# Patient Record
Sex: Female | Born: 1995 | Race: White | Hispanic: No | Marital: Single | Smoking: Current every day smoker
Health system: Southern US, Community
[De-identification: ages and names within clinical notes are randomized; demographics above are authoritative.]

## PROBLEM LIST (undated history)

## (undated) DIAGNOSIS — F419 Anxiety disorder, unspecified: Secondary | ICD-10-CM

## (undated) DIAGNOSIS — G43909 Migraine, unspecified, not intractable, without status migrainosus: Secondary | ICD-10-CM

## (undated) DIAGNOSIS — F329 Major depressive disorder, single episode, unspecified: Secondary | ICD-10-CM

## (undated) DIAGNOSIS — F32A Depression, unspecified: Secondary | ICD-10-CM

---
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
## 2017-07-26 ENCOUNTER — Encounter (HOSPITAL_COMMUNITY): Payer: Self-pay

## 2017-07-26 ENCOUNTER — Other Ambulatory Visit: Payer: Self-pay

## 2017-07-26 ENCOUNTER — Emergency Department (HOSPITAL_COMMUNITY)
Admission: EM | Admit: 2017-07-26 | Discharge: 2017-07-26 | Disposition: A | Discharge status: Home / Self Care | Payer: Medicaid Other | Attending: Emergency Medicine | Admitting: Emergency Medicine

## 2017-07-26 DIAGNOSIS — Z79899 Other long term (current) drug therapy: Secondary | ICD-10-CM | POA: Insufficient documentation

## 2017-07-26 DIAGNOSIS — N63 Unspecified lump in unspecified breast: Secondary | ICD-10-CM

## 2017-07-26 DIAGNOSIS — Z87891 Personal history of nicotine dependence: Secondary | ICD-10-CM | POA: Diagnosis not present

## 2017-07-26 DIAGNOSIS — N6310 Unspecified lump in the right breast, unspecified quadrant: Secondary | ICD-10-CM | POA: Diagnosis not present

## 2017-07-26 DIAGNOSIS — Z91010 Allergy to peanuts: Secondary | ICD-10-CM | POA: Diagnosis not present

## 2017-07-26 DIAGNOSIS — R600 Localized edema: Secondary | ICD-10-CM | POA: Diagnosis present

## 2017-07-26 MED ORDER — OXYCODONE-ACETAMINOPHEN 5-325 MG PO TABS
2.0000 | ORAL_TABLET | Freq: Once | ORAL | Status: DC
  Filled 2017-07-26: qty 2

## 2017-07-26 MED ORDER — ACETAMINOPHEN 325 MG PO TABS
650.0000 mg | ORAL_TABLET | Freq: Once | ORAL | Status: AC
  Administered 2017-07-26: 650 mg via ORAL
  Filled 2017-07-26: qty 2

## 2017-07-26 MED ORDER — DOXYCYCLINE HYCLATE 100 MG PO CAPS: 100.0000 mg | ORAL_CAPSULE | Freq: Two times a day (BID) | ORAL | 0 refills | Status: DC

## 2017-07-26 NOTE — ED Triage Notes (Signed)
Patient reports that she has breast knots and drainage from both nipples. Patient states she has been on antibiotics-amox CLAV 875 mg. Patient states pain has increased, knots are bigger. L>R.

## 2017-07-26 NOTE — Discharge Instructions (Addendum)
Call Dr. Allene PyoNewman's office at 0900 am tomorrow Take all antibiotics as prescribed Dr. Allene PyoNewman's office will arrange for you to be seen in urgent care surgery clinic and assessed for breast mass and infection

## 2017-07-26 NOTE — ED Provider Notes (Signed)
[facility]-EMERGENCY DEPT Provider Note   CSN: [id number] Arrival date & time: 07/26/17  1606     History   Chief Complaint Chief Complaint  Patient presents with  . breast knots  . breast drainage    HPI Meghan Hardy is a 22 y.o. female.  HPI 22 yo female complaiining of breast swelling and discharge left > right.  States she had nipple piercings whch she removed.  She was treated with abx at [facility] and states she finished those but now has worsening pain,swelling and discharge.  She denies fever chills, states normal menstrual cycles and denies pregnancy Denies immunosuppression states or iv drug use  Past Medical History:  Diagnosis Date  . Anxiety   . Depression   . Migraines     Patient Active Problem List   Diagnosis Date Noted  . MDD (major depressive disorder), recurrent episode, severe ([facility]) 05/24/2015  . Suicidal ideation 05/24/2015  . GAD (generalized anxiety disorder) 05/24/2015    History reviewed. No pertinent surgical history.   OB History   None      Home Medications    Prior to Admission medications   Medication Sig Start Date End Date Taking? Authorizing Provider  escitalopram (LEXAPRO) 10 MG tablet Take 1 tablet (10 mg total) by mouth daily. Patient not taking: Reported on 07/26/2017 05/26/15   Thedora Hinders, MD  medroxyPROGESTERone (DEPO-PROVERA) 150 MG/ML injection Inject 150 mg into the muscle every 3 (three) months.    [provider]    Family History Family History  Problem Relation Age of Onset  . Hypertension Mother     Social History Social History   Tobacco Use  . Smoking status: Former Smoker    Packs/day: 1.00    Types: Cigarettes  . Smokeless tobacco: Never Used  Substance Use Topics  . Alcohol use: No  . Drug use: No     Allergies   Latex   Review of Systems Review of Systems  Constitutional: Negative for chills, fever and unexpected weight change.  HENT:  Negative.   Eyes: Negative.   Respiratory: Negative.   Cardiovascular: Negative.   Gastrointestinal: Negative.   Genitourinary: Negative.   Musculoskeletal: Negative.   Skin: Positive for rash.  Neurological: Negative.   Hematological: Negative.   Psychiatric/Behavioral: Negative.   All other systems reviewed and are negative.    Physical Exam Updated Vital Signs BP (!) 125/93 (BP Location: Left Arm)   Pulse 98   Temp 98.7 F (37.1 C) (Oral)   Resp 16   Ht 1.727 m (5\' 8" )   Wt 74.8 kg (165 lb)   LMP 07/04/2017 (Approximate)   SpO2 100%   BMI 25.09 kg/m   Physical Exam  Constitutional: She is oriented to person, place, and time. She appears well-developed and well-nourished.  HENT:  Head: Normocephalic and atraumatic.  Eyes: Pupils are equal, round, and reactive to light.  Cardiovascular: Normal rate and regular rhythm.  Pulmonary/Chest: Effort normal.  Abdominal: Soft.  Genitourinary:  Genitourinary Comments: Left breast with mass noted under nipple with ttp 3 x4 cm No fluctuance or mass noted right breast  Neurological: She is alert and oriented to person, place, and time.  Skin: Capillary refill takes less than 2 seconds.  See breast exam  Nursing note and vitals reviewed.    ED Treatments / Results  Labs (all labs ordered are listed, but only abnormal results are displayed) Labs Reviewed - No data to display  EKG None  Radiology  No results found.  Procedures Procedures (including critical care time)  Medications Ordered in ED Medications - No data to display   Initial Impression / Assessment and Plan / ED Course  I have reviewed the triage vital signs and the nursing notes.  Pertinent labs & imaging results that were available during my care of the patient were reviewed by me and considered in my medical decision making (see chart for details).     General surgery consulted Discussed with Dr. Ezzard StandingNewman.  Patient appears stable here and this has  been subacute with some chronicity.  Plan for her to call office in am and they will arrange for her to be seen in urgent clinic. Plan doxycycline   Final Clinical Impressions(s) / ED Diagnoses   Final diagnoses:  Breast mass    ED Discharge Orders    None       Margarita Grizzleay, Thiago Ragsdale, MD 07/26/17 2122

## 2017-07-28 ENCOUNTER — Ambulatory Visit
Admission: RE | Admit: 2017-07-28 | Discharge: 2017-07-28 | Disposition: A | Discharge status: Home / Self Care | Payer: Medicaid Other | Source: Ambulatory Visit | Attending: Student | Admitting: Student

## 2017-07-28 ENCOUNTER — Other Ambulatory Visit: Payer: Self-pay | Admitting: Student

## 2017-07-28 DIAGNOSIS — L0291 Cutaneous abscess, unspecified: Secondary | ICD-10-CM

## 2017-08-04 ENCOUNTER — Other Ambulatory Visit: Payer: Medicaid Other

## 2017-08-11 ENCOUNTER — Ambulatory Visit
Admission: RE | Admit: 2017-08-11 | Discharge: 2017-08-11 | Disposition: A | Discharge status: Home / Self Care | Payer: Medicaid Other | Source: Ambulatory Visit | Attending: Student | Admitting: Student

## 2017-08-11 ENCOUNTER — Other Ambulatory Visit: Payer: Self-pay | Admitting: Student

## 2017-08-11 DIAGNOSIS — L0291 Cutaneous abscess, unspecified: Secondary | ICD-10-CM

## 2017-08-25 ENCOUNTER — Ambulatory Visit
Admission: RE | Admit: 2017-08-25 | Discharge: 2017-08-25 | Disposition: A | Discharge status: Home / Self Care | Payer: Medicaid Other | Source: Ambulatory Visit | Attending: Student | Admitting: Student

## 2017-08-25 ENCOUNTER — Other Ambulatory Visit: Payer: Self-pay | Admitting: Student

## 2017-08-25 DIAGNOSIS — L0291 Cutaneous abscess, unspecified: Secondary | ICD-10-CM

## 2017-09-07 ENCOUNTER — Other Ambulatory Visit: Payer: Self-pay

## 2017-09-07 ENCOUNTER — Other Ambulatory Visit: Payer: Self-pay | Admitting: Student

## 2017-09-07 DIAGNOSIS — L0291 Cutaneous abscess, unspecified: Secondary | ICD-10-CM

## 2017-09-08 ENCOUNTER — Ambulatory Visit
Admission: RE | Admit: 2017-09-08 | Discharge: 2017-09-08 | Disposition: A | Discharge status: Home / Self Care | Payer: Medicaid Other | Source: Ambulatory Visit | Attending: Student | Admitting: Student

## 2017-09-08 DIAGNOSIS — L0291 Cutaneous abscess, unspecified: Secondary | ICD-10-CM

## 2017-11-23 ENCOUNTER — Other Ambulatory Visit: Payer: Self-pay

## 2017-11-23 ENCOUNTER — Encounter (HOSPITAL_BASED_OUTPATIENT_CLINIC_OR_DEPARTMENT_OTHER): Payer: Self-pay | Admitting: *Deleted

## 2017-11-29 ENCOUNTER — Ambulatory Visit: Payer: Self-pay | Admitting: General Surgery

## 2017-11-30 ENCOUNTER — Other Ambulatory Visit: Payer: Self-pay

## 2017-11-30 ENCOUNTER — Ambulatory Visit (HOSPITAL_BASED_OUTPATIENT_CLINIC_OR_DEPARTMENT_OTHER)
Admission: RE | Admit: 2017-11-30 | Discharge: 2017-11-30 | Disposition: A | Discharge status: Home / Self Care | Payer: Medicaid Other | Source: Ambulatory Visit | Attending: General Surgery | Admitting: General Surgery

## 2017-11-30 ENCOUNTER — Encounter (HOSPITAL_BASED_OUTPATIENT_CLINIC_OR_DEPARTMENT_OTHER)
Admission: RE | Disposition: A | Discharge status: Home / Self Care | Payer: Self-pay | Source: Ambulatory Visit | Attending: General Surgery

## 2017-11-30 ENCOUNTER — Ambulatory Visit (HOSPITAL_BASED_OUTPATIENT_CLINIC_OR_DEPARTMENT_OTHER): Payer: Medicaid Other | Admitting: Certified Registered"

## 2017-11-30 ENCOUNTER — Encounter (HOSPITAL_BASED_OUTPATIENT_CLINIC_OR_DEPARTMENT_OTHER): Payer: Self-pay | Admitting: Certified Registered"

## 2017-11-30 DIAGNOSIS — F419 Anxiety disorder, unspecified: Secondary | ICD-10-CM | POA: Insufficient documentation

## 2017-11-30 DIAGNOSIS — G43909 Migraine, unspecified, not intractable, without status migrainosus: Secondary | ICD-10-CM | POA: Diagnosis not present

## 2017-11-30 DIAGNOSIS — Z8249 Family history of ischemic heart disease and other diseases of the circulatory system: Secondary | ICD-10-CM | POA: Diagnosis not present

## 2017-11-30 DIAGNOSIS — F329 Major depressive disorder, single episode, unspecified: Secondary | ICD-10-CM | POA: Diagnosis not present

## 2017-11-30 DIAGNOSIS — Z818 Family history of other mental and behavioral disorders: Secondary | ICD-10-CM | POA: Diagnosis not present

## 2017-11-30 DIAGNOSIS — Z9012 Acquired absence of left breast and nipple: Secondary | ICD-10-CM | POA: Diagnosis present

## 2017-11-30 DIAGNOSIS — Z91040 Latex allergy status: Secondary | ICD-10-CM | POA: Diagnosis not present

## 2017-11-30 DIAGNOSIS — Z833 Family history of diabetes mellitus: Secondary | ICD-10-CM | POA: Insufficient documentation

## 2017-11-30 DIAGNOSIS — F17200 Nicotine dependence, unspecified, uncomplicated: Secondary | ICD-10-CM | POA: Diagnosis not present

## 2017-11-30 DIAGNOSIS — N611 Abscess of the breast and nipple: Secondary | ICD-10-CM | POA: Diagnosis not present

## 2017-11-30 DIAGNOSIS — Z811 Family history of alcohol abuse and dependence: Secondary | ICD-10-CM | POA: Diagnosis not present

## 2017-11-30 HISTORY — PX: EXCISION OF BREAST LESION: SHX6676

## 2017-11-30 LAB — POCT PREGNANCY, URINE: Preg Test, Ur: NEGATIVE

## 2017-11-30 SURGERY — EXCISION OF BREAST LESION
Anesthesia: General | Site: Breast | Laterality: Left

## 2017-11-30 MED ORDER — CEFAZOLIN SODIUM-DEXTROSE 2-4 GM/100ML-% IV SOLN
2.0000 g | INTRAVENOUS | Status: AC
  Administered 2017-11-30: 2 g via INTRAVENOUS

## 2017-11-30 MED ORDER — LIDOCAINE 2% (20 MG/ML) 5 ML SYRINGE: INTRAMUSCULAR | Status: DC | PRN

## 2017-11-30 MED ORDER — PROPOFOL 10 MG/ML IV BOLUS
INTRAVENOUS | Status: DC | PRN
  Administered 2017-11-30: 200 mg via INTRAVENOUS

## 2017-11-30 MED ORDER — METOCLOPRAMIDE HCL 5 MG/ML IJ SOLN: 10.0000 mg | Freq: Once | INTRAMUSCULAR | Status: DC | PRN

## 2017-11-30 MED ORDER — LIDOCAINE 2% (20 MG/ML) 5 ML SYRINGE
INTRAMUSCULAR | Status: DC | PRN
  Administered 2017-11-30: 60 mg via INTRAVENOUS

## 2017-11-30 MED ORDER — MIDAZOLAM HCL 2 MG/2ML IJ SOLN
INTRAMUSCULAR | Status: AC
  Filled 2017-11-30: qty 2

## 2017-11-30 MED ORDER — MIDAZOLAM HCL 2 MG/2ML IJ SOLN: 1.0000 mg | INTRAMUSCULAR | Status: DC | PRN

## 2017-11-30 MED ORDER — FENTANYL CITRATE (PF) 100 MCG/2ML IJ SOLN: 25.0000 ug | INTRAMUSCULAR | Status: DC | PRN

## 2017-11-30 MED ORDER — FENTANYL CITRATE (PF) 100 MCG/2ML IJ SOLN
INTRAMUSCULAR | Status: AC
  Filled 2017-11-30: qty 2

## 2017-11-30 MED ORDER — DOXYCYCLINE HYCLATE 100 MG PO TABS: 100.0000 mg | ORAL_TABLET | Freq: Two times a day (BID) | ORAL | 1 refills | Status: AC

## 2017-11-30 MED ORDER — HYDROCODONE-ACETAMINOPHEN 5-325 MG PO TABS: 1.0000 | ORAL_TABLET | Freq: Four times a day (QID) | ORAL | 0 refills | Status: AC | PRN

## 2017-11-30 MED ORDER — CEFAZOLIN SODIUM-DEXTROSE 2-4 GM/100ML-% IV SOLN
INTRAVENOUS | Status: AC
  Filled 2017-11-30: qty 100

## 2017-11-30 MED ORDER — GABAPENTIN 300 MG PO CAPS
300.0000 mg | ORAL_CAPSULE | ORAL | Status: AC
  Administered 2017-11-30: 300 mg via ORAL

## 2017-11-30 MED ORDER — CELECOXIB 200 MG PO CAPS
ORAL_CAPSULE | ORAL | Status: AC
  Filled 2017-11-30: qty 1

## 2017-11-30 MED ORDER — MIDAZOLAM HCL 5 MG/5ML IJ SOLN
INTRAMUSCULAR | Status: DC | PRN
  Administered 2017-11-30: 2 mg via INTRAVENOUS

## 2017-11-30 MED ORDER — GABAPENTIN 300 MG PO CAPS
ORAL_CAPSULE | ORAL | Status: AC
  Filled 2017-11-30: qty 1

## 2017-11-30 MED ORDER — ACETAMINOPHEN 500 MG PO TABS
1000.0000 mg | ORAL_TABLET | ORAL | Status: AC
  Administered 2017-11-30: 1000 mg via ORAL

## 2017-11-30 MED ORDER — LIDOCAINE 2% (20 MG/ML) 5 ML SYRINGE
INTRAMUSCULAR | Status: AC
  Filled 2017-11-30: qty 5

## 2017-11-30 MED ORDER — MEPERIDINE HCL 25 MG/ML IJ SOLN: 6.2500 mg | INTRAMUSCULAR | Status: DC | PRN

## 2017-11-30 MED ORDER — SCOPOLAMINE 1 MG/3DAYS TD PT72: 1.0000 | MEDICATED_PATCH | Freq: Once | TRANSDERMAL | Status: DC | PRN

## 2017-11-30 MED ORDER — CHLORHEXIDINE GLUCONATE CLOTH 2 % EX PADS: 6.0000 | MEDICATED_PAD | Freq: Once | CUTANEOUS | Status: DC

## 2017-11-30 MED ORDER — DEXAMETHASONE SODIUM PHOSPHATE 10 MG/ML IJ SOLN
INTRAMUSCULAR | Status: DC | PRN
  Administered 2017-11-30: 10 mg via INTRAVENOUS

## 2017-11-30 MED ORDER — CELECOXIB 200 MG PO CAPS
200.0000 mg | ORAL_CAPSULE | ORAL | Status: AC
  Administered 2017-11-30: 200 mg via ORAL

## 2017-11-30 MED ORDER — ACETAMINOPHEN 500 MG PO TABS
ORAL_TABLET | ORAL | Status: AC
  Filled 2017-11-30: qty 2

## 2017-11-30 MED ORDER — LACTATED RINGERS IV SOLN
INTRAVENOUS | Status: DC
  Administered 2017-11-30: 10:00:00 via INTRAVENOUS

## 2017-11-30 MED ORDER — PROPOFOL 10 MG/ML IV BOLUS
INTRAVENOUS | Status: AC
  Filled 2017-11-30: qty 20

## 2017-11-30 MED ORDER — FENTANYL CITRATE (PF) 100 MCG/2ML IJ SOLN
INTRAMUSCULAR | Status: DC | PRN
  Administered 2017-11-30 (×2): 50 ug via INTRAVENOUS

## 2017-11-30 MED ORDER — ONDANSETRON HCL 4 MG/2ML IJ SOLN
INTRAMUSCULAR | Status: DC | PRN
  Administered 2017-11-30 (×2): 4 mg via INTRAVENOUS

## 2017-11-30 MED ORDER — FENTANYL CITRATE (PF) 100 MCG/2ML IJ SOLN: 50.0000 ug | INTRAMUSCULAR | Status: DC | PRN

## 2017-11-30 MED ORDER — PROPOFOL 10 MG/ML IV BOLUS: INTRAVENOUS | Status: DC | PRN

## 2017-11-30 MED ORDER — BUPIVACAINE HCL (PF) 0.25 % IJ SOLN
INTRAMUSCULAR | Status: DC | PRN
  Administered 2017-11-30: 25 mL

## 2017-11-30 MED ORDER — LACTATED RINGERS IV SOLN: INTRAVENOUS | Status: DC

## 2017-11-30 SURGICAL SUPPLY — 49 items
ADH SKN CLS APL DERMABOND .7 (GAUZE/BANDAGES/DRESSINGS) ×1
APPLIER CLIP 9.375 MED OPEN (MISCELLANEOUS)
APR CLP MED 9.3 20 MLT OPN (MISCELLANEOUS)
BLADE SURG 15 STRL LF DISP TIS (BLADE) ×1 IMPLANT
BLADE SURG 15 STRL SS (BLADE) ×3
CANISTER SUCT 1200ML W/VALVE (MISCELLANEOUS) ×3 IMPLANT
CHLORAPREP W/TINT 26ML (MISCELLANEOUS) ×3 IMPLANT
CLIP APPLIE 9.375 MED OPEN (MISCELLANEOUS) IMPLANT
CLIP VESOCCLUDE SM WIDE 6/CT (CLIP) IMPLANT
COVER BACK TABLE 60X90IN (DRAPES) ×3 IMPLANT
COVER MAYO STAND STRL (DRAPES) ×3 IMPLANT
DECANTER SPIKE VIAL GLASS SM (MISCELLANEOUS) ×1 IMPLANT
DERMABOND ADVANCED (GAUZE/BANDAGES/DRESSINGS) ×2
DERMABOND ADVANCED .7 DNX12 (GAUZE/BANDAGES/DRESSINGS) ×1 IMPLANT
DEVICE DUBIN W/COMP PLATE 8390 (MISCELLANEOUS) IMPLANT
DRAPE LAPAROSCOPIC ABDOMINAL (DRAPES) ×3 IMPLANT
DRAPE UTILITY XL STRL (DRAPES) ×3 IMPLANT
ELECT COATED BLADE 2.86 ST (ELECTRODE) ×3 IMPLANT
ELECT REM PT RETURN 9FT ADLT (ELECTROSURGICAL) ×3
ELECTRODE REM PT RTRN 9FT ADLT (ELECTROSURGICAL) ×1 IMPLANT
GLOVE BIO SURGEON STRL SZ7.5 (GLOVE) IMPLANT
GLOVE EXAM NITRILE MD LF STRL (GLOVE) ×2 IMPLANT
GLOVE SURG SS PI 7.0 STRL IVOR (GLOVE) ×2 IMPLANT
GLOVE SURG SS PI 7.5 STRL IVOR (GLOVE) ×2 IMPLANT
GOWN STRL REUS W/ TWL LRG LVL3 (GOWN DISPOSABLE) ×2 IMPLANT
GOWN STRL REUS W/TWL LRG LVL3 (GOWN DISPOSABLE) ×6
ILLUMINATOR WAVEGUIDE N/F (MISCELLANEOUS) IMPLANT
KIT MARKER MARGIN INK (KITS) IMPLANT
LIGHT WAVEGUIDE WIDE FLAT (MISCELLANEOUS) IMPLANT
NDL HYPO 25X1 1.5 SAFETY (NEEDLE) ×1 IMPLANT
NEEDLE HYPO 25X1 1.5 SAFETY (NEEDLE) ×3 IMPLANT
NS IRRIG 1000ML POUR BTL (IV SOLUTION) ×3 IMPLANT
PACK BASIN DAY SURGERY FS (CUSTOM PROCEDURE TRAY) ×3 IMPLANT
PENCIL BUTTON HOLSTER BLD 10FT (ELECTRODE) ×3 IMPLANT
SLEEVE SCD COMPRESS KNEE MED (MISCELLANEOUS) ×3 IMPLANT
SPONGE LAP 18X18 RF (DISPOSABLE) ×3 IMPLANT
SUT MON AB 3-0 SH 27 (SUTURE) ×3
SUT MON AB 3-0 SH27 (SUTURE) IMPLANT
SUT MON AB 4-0 PC3 18 (SUTURE) ×3 IMPLANT
SUT SILK 2 0 SH (SUTURE) ×3 IMPLANT
SUT VICRYL 3-0 CR8 SH (SUTURE) ×3 IMPLANT
SWAB COLLECTION DEVICE MRSA (MISCELLANEOUS) ×2 IMPLANT
SYR CONTROL 10ML LL (SYRINGE) ×3 IMPLANT
TOWEL GREEN STERILE FF (TOWEL DISPOSABLE) ×3 IMPLANT
TOWEL OR NON WOVEN STRL DISP B (DISPOSABLE) ×1 IMPLANT
TUBE ANAEROBIC SPECIMEN COL (MISCELLANEOUS) ×2 IMPLANT
TUBE CONNECTING 20'X1/4 (TUBING) ×1
TUBE CONNECTING 20X1/4 (TUBING) ×2 IMPLANT
YANKAUER SUCT BULB TIP NO VENT (SUCTIONS) ×3 IMPLANT

## 2017-11-30 NOTE — Op Note (Signed)
11/30/2017  11:19 AM  PATIENT:  Meghan FormosaKelly Hardy  22 y.o. female  PRE-OPERATIVE DIAGNOSIS:  left breast chronic abscess  POST-OPERATIVE DIAGNOSIS:  left breast chronic abscess  PROCEDURE:  Procedure(s): LEFT BREAST  SUBAREOLAR MASS EXCISION (Left)  SURGEON:  Surgeon(s) and Role:    Griselda Miner* Toth, Yader Criger III, MD - Primary  PHYSICIAN ASSISTANT:   ASSISTANTS: none   ANESTHESIA:   local and general  EBL:  2 mL   BLOOD ADMINISTERED:none  DRAINS: none   LOCAL MEDICATIONS USED:  MARCAINE     SPECIMEN:  Source of Specimen:  left breast tissue and culture  DISPOSITION OF SPECIMEN:  PATHOLOGY  COUNTS:  YES  TOURNIQUET:  * No tourniquets in log *  DICTATION: .Dragon Dictation   After informed consent was obtained the patient was brought to the operating room and placed in the supine position on the operating table.  After adequate induction of general anesthesia the patient's left breast was prepped with ChloraPrep, allowed to dry, and draped in usual sterile manner.  An appropriate timeout was performed.  The patient had a palpable mass in the subareolar upper portion of the left breast that was felt to be a chronic abscess.  There was no sign of acute infection today.  The area around this was infiltrated with quarter percent Marcaine.  A curvilinear incision was then made along the upper edge of the areola with a 15 blade knife.  The incision was carried through the skin and subcutaneous tissue sharply with the electrocautery.  The palpable mass was then excised sharply with the electrocautery.  The superficial edge of the mass was entered and a moderate amount of pus was evacuated.  Cultures were obtained.  The superficial surface was covered with granulation tissue and this was fulgurated with cautery.  The wound was then infiltrated with more quarter percent Marcaine and irrigated with copious amounts of saline.  The mass was sent to pathology for further evaluation.  The deep layer of the  wound was closed with an interrupted 3-0 Monocryl stitch.  The skin was then closed with interrupted 4-0 Monocryl subcuticular stitches.  Dermabond dressings were applied.  The patient tolerated the procedure well.  At the end of the case all needle sponge and instrument counts were correct.  The patient was then awakened and taken to recovery in stable condition.  PLAN OF CARE: Discharge to home after PACU  PATIENT DISPOSITION:  PACU - hemodynamically stable.   Delay start of Pharmacological VTE agent (>24hrs) due to surgical blood loss or risk of bleeding: not applicable

## 2017-11-30 NOTE — Transfer of Care (Signed)
Immediate Anesthesia Transfer of Care Note  Patient: Meghan Hardy  Procedure(s) Performed: LEFT BREAST  SUBAREOLAR MASS EXCISION (Left Breast)  Patient Location: PACU  Anesthesia Type:General  Level of Consciousness: drowsy  Airway & Oxygen Therapy: Patient Spontanous Breathing and Patient connected to face mask oxygen  Post-op Assessment: Report given to RN and Post -op Vital signs reviewed and stable  Post vital signs: Reviewed and stable  Last Vitals:  Vitals Value Taken Time  BP 90/49 11/30/2017 11:15 AM  Temp    Pulse 89 11/30/2017 11:17 AM  Resp 12 11/30/2017 11:17 AM  SpO2 99 % 11/30/2017 11:17 AM  Vitals shown include unvalidated device data.  Last Pain:  Vitals:   11/30/17 0911  TempSrc: Oral         Complications: No apparent anesthesia complications

## 2017-11-30 NOTE — Discharge Instructions (Signed)
No Tylenol until 3:15pm. No ibuprofen until 5:15pm.    Post Anesthesia Home Care Instructions  Activity: Get plenty of rest for the remainder of the day. A responsible individual must stay with you for 24 hours following the procedure.  For the next 24 hours, DO NOT: -Drive a car -Advertising copywriterperate machinery -Drink alcoholic beverages -Take any medication unless instructed by your physician -Make any legal decisions or sign important papers.  Meals: Start with liquid foods such as gelatin or soup. Progress to regular foods as tolerated. Avoid greasy, spicy, heavy foods. If nausea and/or vomiting occur, drink only clear liquids until the nausea and/or vomiting subsides. Call your physician if vomiting continues.  Special Instructions/Symptoms: Your throat may feel dry or sore from the anesthesia or the breathing tube placed in your throat during surgery. If this causes discomfort, gargle with warm salt water. The discomfort should disappear within 24 hours.  If you had a scopolamine patch placed behind your ear for the management of post- operative nausea and/or vomiting:  1. The medication in the patch is effective for 72 hours, after which it should be removed.  Wrap patch in a tissue and discard in the trash. Wash hands thoroughly with soap and water. 2. You may remove the patch earlier than 72 hours if you experience unpleasant side effects which may include dry mouth, dizziness or visual disturbances. 3. Avoid touching the patch. Wash your hands with soap and water after contact with the patch.

## 2017-11-30 NOTE — Anesthesia Procedure Notes (Signed)
Procedure Name: LMA Insertion Date/Time: 11/30/2017 10:32 AM Performed by: Marny Lowensteinapozzi, Rayleigh Gillyard W, CRNA Pre-anesthesia Checklist: Patient identified, Emergency Drugs available, Suction available and Patient being monitored Patient Re-evaluated:Patient Re-evaluated prior to induction Oxygen Delivery Method: Circle system utilized Preoxygenation: Pre-oxygenation with 100% oxygen Induction Type: IV induction Ventilation: Mask ventilation without difficulty LMA: LMA inserted LMA Size: 4.0 Number of attempts: 1 Placement Confirmation: positive ETCO2,  CO2 detector and breath sounds checked- equal and bilateral Tube secured with: Tape Dental Injury: Teeth and Oropharynx as per pre-operative assessment

## 2017-11-30 NOTE — Interval H&P Note (Signed)
History and Physical Interval Note:  11/30/2017 10:09 AM  Meghan Hardy  has presented today for surgery, with the diagnosis of left breast chronic absecess  The various methods of treatment have been discussed with the patient and family. After consideration of risks, benefits and other options for treatment, the patient has consented to  Procedure(s): LEFT BREAST  SUBAREOLAR MASS EXCISION (Left) as a surgical intervention .  The patient's history has been reviewed, patient examined, no change in status, stable for surgery.  I have reviewed the patient's chart and labs.  Questions were answered to the patient's satisfaction.     TOTH III,PAUL S

## 2017-11-30 NOTE — H&P (Signed)
Meghan FormosaKelly Hardy  [facility] Patient #: [id number] DOB: 1996-02-13 Single / Language: [place] / Race: White Female   History of Present Illness  The patient is a 22 year old female who presents with a breast abscess. The patient is a 22 year old white female who has been experiencing painful masses with drainage from the nipple in both breasts for the last 4 months. She denies any fevers. She has been treated with several rounds of antibiotics which seemed to improve the situation but never make it go away completely. She has been having discharge from both nipples over the last couple weeks but more so on the left side. She does smoke. She is otherwise healthy.   Past Surgical History  No pertinent past surgical history   Diagnostic Studies History Colonoscopy  never Mammogram  never Pap Smear  never  Allergies  Latex Exam Gloves *MEDICAL DEVICES AND SUPPLIES*   Medication History  No Current Medications  Social History  Caffeine use  Carbonated beverages, Coffee, Tea. No alcohol use  No drug use  Tobacco use  Current every day smoker.  Family History  Alcohol Abuse  Father. Depression  Mother. Diabetes Mellitus  Mother. Hypertension  Mother.  Pregnancy / Birth History  Age at menarche  14 years. Contraceptive History  Depo-provera. Gravida  0 Irregular periods  Para  0  Other Problems  Anxiety Disorder  Depression  Lump In Breast  Migraine Headache     Review of Systems General Not Present- Appetite Loss, Chills, Fatigue, Fever, Night Sweats, Weight Gain and Weight Loss. Note: All other systems negative (unless as noted in HPI & included Review of Systems) Skin Not Present- Change in Wart/Mole, Dryness, Hives, Jaundice, New Lesions, Non-Healing Wounds, Rash and Ulcer. HEENT Not Present- Earache, Hearing Loss, Hoarseness, Nose Bleed, Oral Ulcers, Ringing in the Ears, Seasonal Allergies, Sinus Pain, Sore Throat,  Visual Disturbances, Wears glasses/contact lenses and Yellow Eyes. Respiratory Not Present- Bloody sputum, Chronic Cough, Difficulty Breathing, Snoring and Wheezing. Breast Not Present- Breast Mass, Breast Pain, Nipple Discharge and Skin Changes. Cardiovascular Not Present- Chest Pain, Difficulty Breathing Lying Down, Leg Cramps, Palpitations, Rapid Heart Rate, Shortness of Breath and Swelling of Extremities. Gastrointestinal Not Present- Abdominal Pain, Bloating, Bloody Stool, Change in Bowel Habits, Chronic diarrhea, Constipation, Difficulty Swallowing, Excessive gas, Gets full quickly at meals, Hemorrhoids, Indigestion, Nausea, Rectal Pain and Vomiting. Female Genitourinary Not Present- Frequency, Nocturia, Painful Urination, Pelvic Pain and Urgency. Musculoskeletal Not Present- Back Pain, Joint Pain, Joint Stiffness, Muscle Pain, Muscle Weakness and Swelling of Extremities. Neurological Not Present- Decreased Memory, Fainting, Headaches, Numbness, Seizures, Tingling, Tremor, Trouble walking and Weakness. Psychiatric Not Present- Anxiety, Bipolar, Change in Sleep Pattern, Depression, Fearful and Frequent crying. Endocrine Not Present- Cold Intolerance, Excessive Hunger, Hair Changes, Heat Intolerance, Hot flashes and New Diabetes. Hematology Not Present- Easy Bruising, Excessive bleeding, Gland problems, HIV and Persistent Infections.  Vitals  Weight: 167.38 lb Height: 68in Body Surface Area: 1.89 m Body Mass Index: 25.45 kg/m  Temp.: 98.74F(Oral)  Pulse: 100 (Regular)  Resp.: 18 (Unlabored)  P.OX: 98% (Room air) BP: 110/82 (Sitting, Left Arm, Standard)       Physical Exam  General Mental Status-Alert. General Appearance-Consistent with stated age. Hydration-Well hydrated. Voice-Normal.  Head and Neck Head-normocephalic, atraumatic with no lesions or palpable masses. Trachea-midline. Thyroid Gland Characteristics - normal size and  consistency.  Eye Eyeball - Bilateral-Extraocular movements intact. Sclera/Conjunctiva - Bilateral-No scleral icterus.  Chest and Lung Exam Chest and lung exam reveals -quiet,  even and easy respiratory effort with no use of accessory muscles and on auscultation, normal breath sounds, no adventitious sounds and normal vocal resonance. Inspection Chest Wall - Normal. Back - normal.  Breast Note: There is a 2 cm palpable subareolar mass in the upper inner quadrant of the left breast with some mild retraction of the nipple. There is no palpable mass of the right breast. There is no palpable axillary, supraclavicular, or cervical lymphadenopathy. There is no cellulitis or discharge from either nipple with palpation   Cardiovascular Cardiovascular examination reveals -normal heart sounds, regular rate and rhythm with no murmurs and normal pedal pulses bilaterally.  Abdomen Inspection Inspection of the abdomen reveals - No Hernias. Skin - Scar - no surgical scars. Palpation/Percussion Palpation and Percussion of the abdomen reveal - Soft, Non Tender, No Rebound tenderness, No Rigidity (guarding) and No hepatosplenomegaly. Auscultation Auscultation of the abdomen reveals - Bowel sounds normal.  Neurologic Neurologic evaluation reveals -alert and oriented x 3 with no impairment of recent or remote memory. Mental Status-Normal.  Musculoskeletal Normal Exam - Left-Upper Extremity Strength Normal and Lower Extremity Strength Normal. Normal Exam - Right-Upper Extremity Strength Normal and Lower Extremity Strength Normal.  Lymphatic Head & Neck  General Head & Neck Lymphatics: Bilateral - Description - Normal. Axillary  General Axillary Region: Bilateral - Description - Normal. Tenderness - Non Tender. Femoral & Inguinal  Generalized Femoral & Inguinal Lymphatics: Bilateral - Description - Normal. Tenderness - Non Tender.    Assessment & Plan  LEFT BREAST ABSCESS  (N61.1) Impression: The patient appears to have a chronic abscess cavity in the upper inner subareolar left breast. It does not appear to be actively infected today. Because of its recurrent nature at Inc. she would benefit from having this area removed. She would also like to have this done. I have discussed with her in detail the risks and benefits of the operation as well as some of the technical aspects including the risks of infection and she understands and wishes to proceed. I will plan for a left breast subareolar Mass excision

## 2017-11-30 NOTE — Anesthesia Preprocedure Evaluation (Signed)
Anesthesia Evaluation  Patient identified by MRN, date of birth, ID band Patient awake    Reviewed: Allergy & Precautions, NPO status , Patient's Chart, lab work & pertinent test results  Airway Mallampati: II  TM Distance: >3 FB Neck ROM: Full    Dental no notable dental hx.    Pulmonary Current Smoker,    Pulmonary exam normal breath sounds clear to auscultation       Cardiovascular negative cardio ROS Normal cardiovascular exam Rhythm:Regular Rate:Normal     Neuro/Psych negative neurological ROS  negative psych ROS   GI/Hepatic negative GI ROS, Neg liver ROS,   Endo/Other  negative endocrine ROS  Renal/GU negative Renal ROS  negative genitourinary   Musculoskeletal negative musculoskeletal ROS (+)   Abdominal   Peds negative pediatric ROS (+)  Hematology negative hematology ROS (+)   Anesthesia Other Findings   Reproductive/Obstetrics negative OB ROS                             Anesthesia Physical Anesthesia Plan  ASA: II  Anesthesia Plan: General   Post-op Pain Management:    Induction: Intravenous  PONV Risk Score and Plan: 2 and Ondansetron, Dexamethasone and Treatment may vary due to age or medical condition  Airway Management Planned: LMA  Additional Equipment:   Intra-op Plan:   Post-operative Plan: Extubation in OR  Informed Consent: I have reviewed the patients History and Physical, chart, labs and discussed the procedure including the risks, benefits and alternatives for the proposed anesthesia with the patient or authorized representative who has indicated his/her understanding and acceptance.   Dental advisory given  Plan Discussed with: CRNA  Anesthesia Plan Comments:         Anesthesia Quick Evaluation

## 2017-12-01 ENCOUNTER — Encounter (HOSPITAL_BASED_OUTPATIENT_CLINIC_OR_DEPARTMENT_OTHER): Payer: Self-pay | Admitting: General Surgery

## 2017-12-01 NOTE — Anesthesia Postprocedure Evaluation (Signed)
Anesthesia Post Note  Patient: Meghan Hardy  Procedure(s) Performed: LEFT BREAST  SUBAREOLAR MASS EXCISION (Left Breast)     Patient location during evaluation: PACU Anesthesia Type: General Level of consciousness: awake and alert Pain management: pain level controlled Vital Signs Assessment: post-procedure vital signs reviewed and stable Respiratory status: spontaneous breathing, nonlabored ventilation, respiratory function stable and patient connected to nasal cannula oxygen Cardiovascular status: blood pressure returned to baseline and stable Postop Assessment: no apparent nausea or vomiting Anesthetic complications: no    Last Vitals:  Vitals:   11/30/17 1200 11/30/17 1216  BP: 104/78 121/68  Pulse: 71 81  Resp: 16   Temp:  36.6 C  SpO2: 100% 99%    Last Pain:  Vitals:   11/30/17 1216  TempSrc:   PainSc: 0-No pain   Pain Goal:                 Phillips Groutarignan, Prescott Truex

## 2017-12-05 LAB — AEROBIC/ANAEROBIC CULTURE (SURGICAL/DEEP WOUND)

## 2017-12-05 LAB — AEROBIC/ANAEROBIC CULTURE W GRAM STAIN (SURGICAL/DEEP WOUND)

## 2018-04-04 ENCOUNTER — Other Ambulatory Visit: Payer: Self-pay | Admitting: General Surgery

## 2019-10-11 IMAGING — US ULTRASOUND RIGHT BREAST LIMITED
1 series · 2 of 2 positions shown · non-contrast
Comparison: 07/28/2017

CLINICAL DATA: The patient returns for follow-up of both breasts.
History of mastitis, LEFT greater than RIGHT. Patient has completed
courses of Augmentin and doxycycline. She reports persistent pain in
both breasts, LEFT greater than RIGHT. She reports persistent bloody
pus from the LEFT nipple, in her estimate, approximately a
dime-sized amount daily. She denies fevers. History of nipple
piercings 1 year ago, removed by patient 6 months ago.

EXAM:
ULTRASOUND OF THE BILATERAL BREAST

[Series 1: ultrasound right breast limited · 0.07mm/px · 2 of 2 slices shown]
[im 1/2]
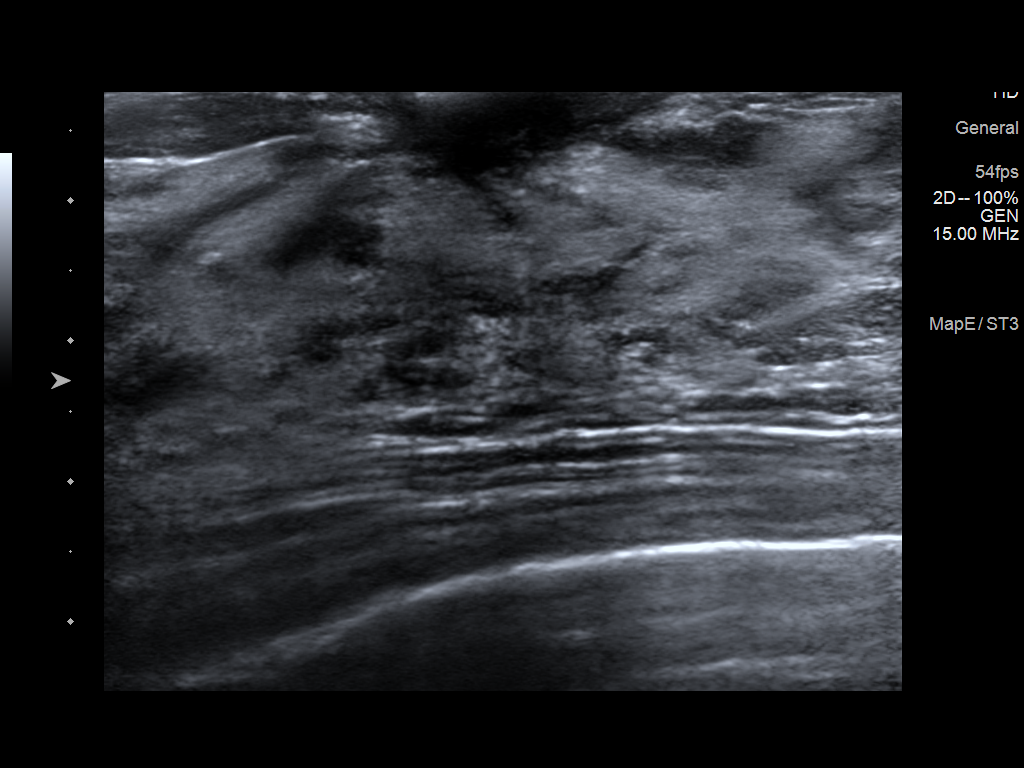
[im 2/2]
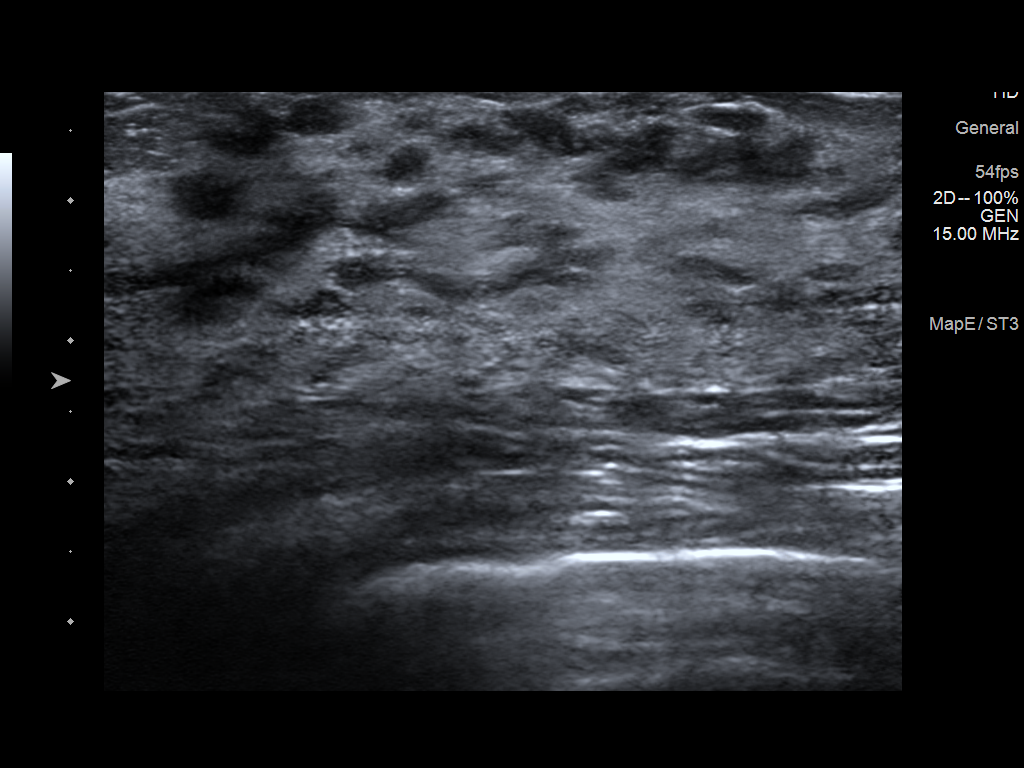

[2 of 2 positions shown; findings below may reference images not displayed]

FINDINGS: Right breast:

Physical Exam: There is no erythema in the RIGHT breast. I palpate
no mass in the area of patient's concern in the 6 o'clock location.

Ultrasound: Targeted ultrasound is performed, showing normal
appearing dense fibroglandular tissue in the immediate retroareolar
region and the LOWER portion of the breast. No suspicious mass,
distortion, or acoustic shadowing is demonstrated with ultrasound.

Left breast:

Physical Exam: There is no erythema in the LEFT breast. I palpate
focal thickening in the immediate retroareolar region. Patient is on
mildly tender on physical exam. There is no current drainage from
the LEFT nipple or periareolar region.

Ultrasound: Targeted ultrasound is performed, showing a small
hypoechoic mass in the immediate retroareolar region of the LEFT
breast measuring 0.9 x 0.8 x 0.9 centimeters. There is significant
vascularity throughout this small mass without drainable fluid
collection.
IMPRESSION: 1. RIGHT breast appears normal at this time. No specific follow-up
is felt to be necessary.
2. Resolving LEFT retroareolar abscess. There is no drainable fluid
on today's exam.
3. Aspiration is not indicated at this time. The patient is doing
well off antibiotics.

RECOMMENDATION:
Followup LEFT breast ultrasound in 2 weeks. If the patient has
worsening symptoms, I encouraged her to call sooner.

I have discussed the findings and recommendations with the patient.
Results were also provided in writing at the conclusion of the
visit. If applicable, a reminder letter will be sent to the patient
regarding the next appointment.

BI-RADS CATEGORY  3: Probably benign.

## 2019-11-08 IMAGING — US ULTRASOUND LEFT BREAST LIMITED
1 series · 6 of 6 positions shown · non-contrast
Comparison: 08/25/2017, 08/11/2017, 07/28/2017.

CLINICAL DATA: 22-year-old with a small LEFT breast
phlegmon/abscess initially diagnosed on 07/28/2017 after presenting
to the [HOSPITAL] that time. The patient completed a 10
day course of clindamycin on 09/06/2017. She returns for follow-up
after antibiotic therapy. She states that the lump has gotten
smaller and is less painful after antibiotic therapy.

EXAM:
ULTRASOUND OF THE LEFT BREAST

[Series 1: ultrasound left breast limited · 0.07mm/px · 6 of 6 slices shown]
[im 1/6]
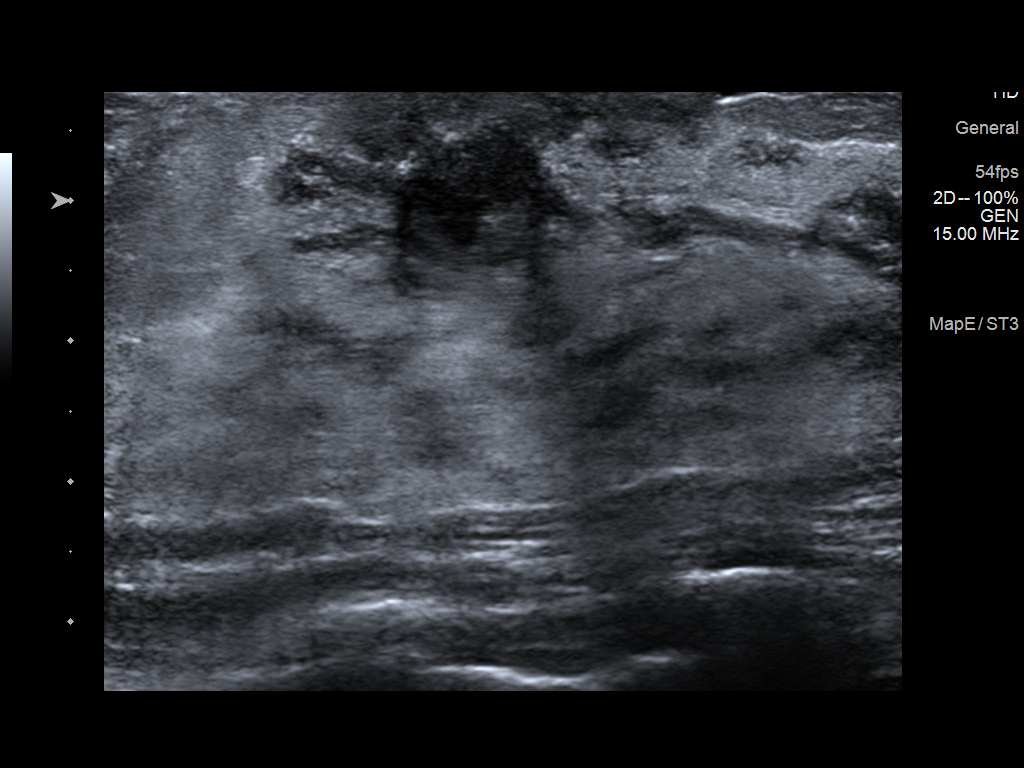
[im 2/6]
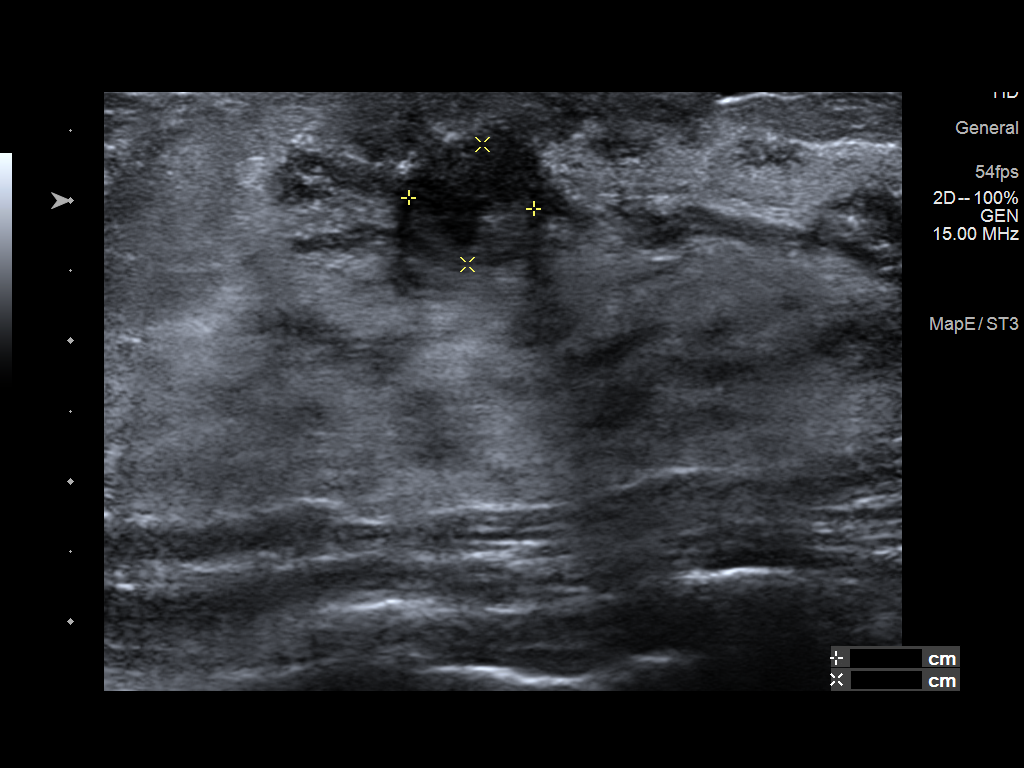
[im 3/6]
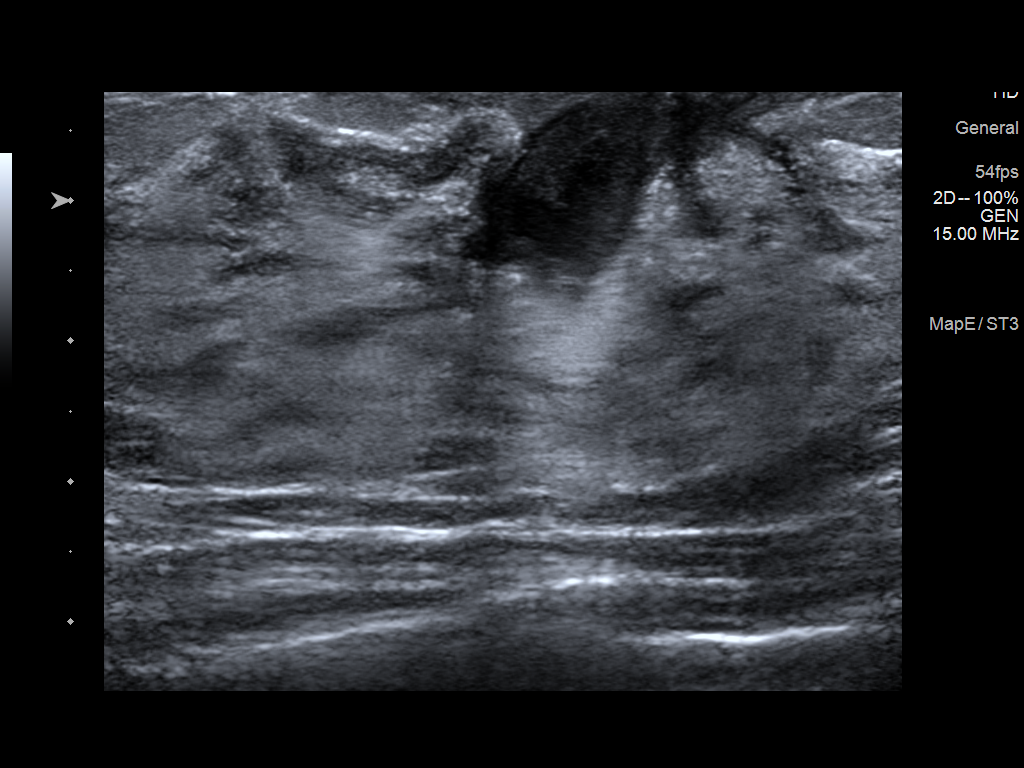
[im 4/6]
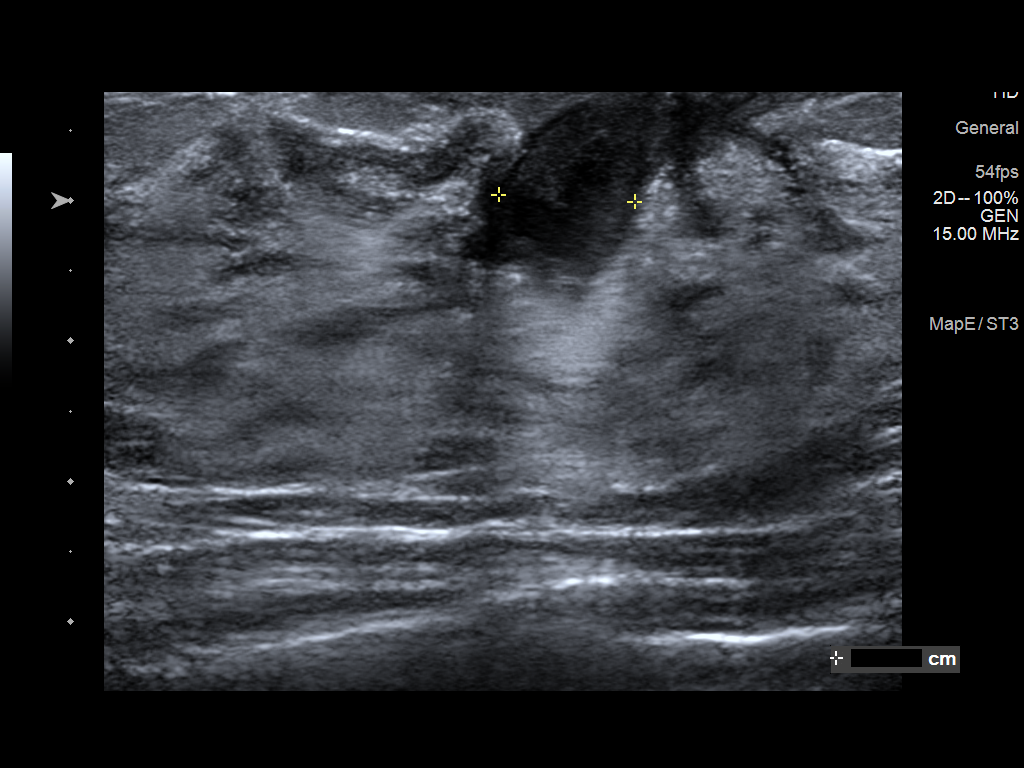
[im 5/6]
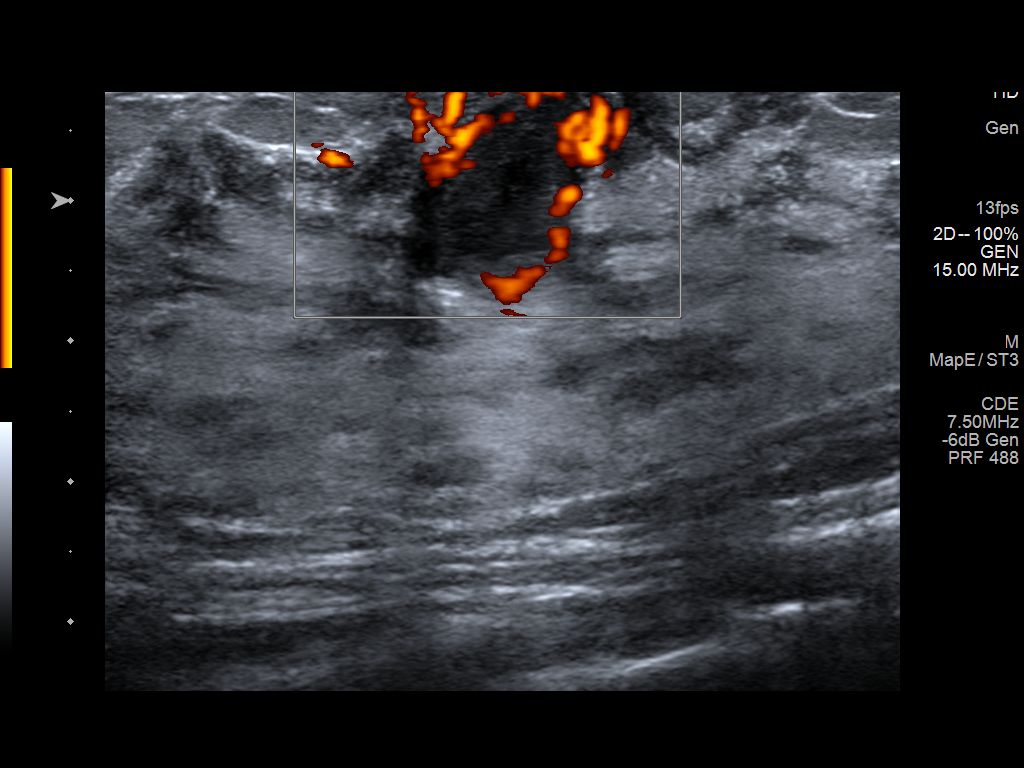
[im 6/6]
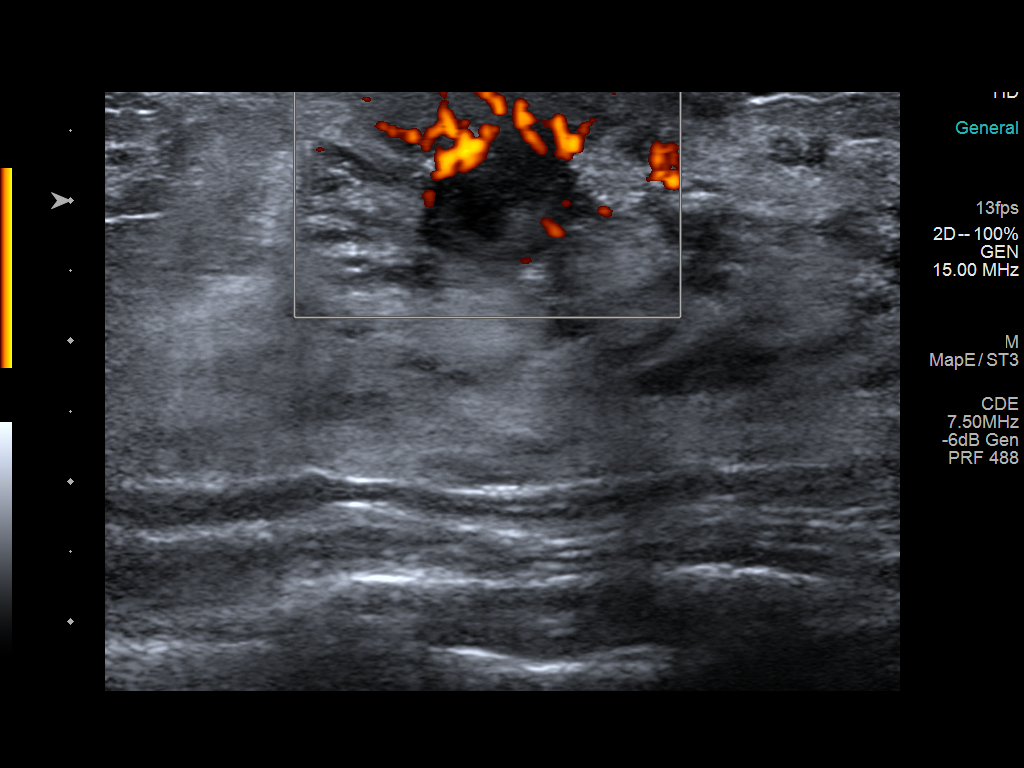

[6 of 6 positions shown; findings below may reference images not displayed]

FINDINGS: On physical exam, there is no erythema involving the periareolar
LEFT breast. There is no palpable thickening or palpable mass.

Targeted LEFT breast ultrasound is performed, showing a persistent
hypoechoic focus at the 11 o'clock subareolar location measuring in
total approximately 0.9 x 0.9 x 1.0 cm, decreased in size when
compared to the initial 07/28/2017 ultrasound, not significantly
changed since the 08/25/2017 ultrasound. There is no drainable fluid
component. There is mild hyperemia surrounding the hypoechoic focus
on power Doppler evaluation.
IMPRESSION: Inflammatory mass/phlegmon involving the UPPER INNER subareolar LEFT
breast, unchanged since the ultrasound 2 weeks ago, smaller than the
ultrasound at the time of original presentation on 07/28/2017. There
is no drainable abscess.

RECOMMENDATION:
The patient was counseled that no further imaging follow-up is felt
necessary at this time and that should the inflammatory mass
increase in size or should her symptoms worsen, she should contact
[REDACTED] for consultation with a breast surgeon.

I have discussed the findings and recommendations with the patient.
Results were also provided in writing at the conclusion of the
visit.

BI-RADS CATEGORY  2: Benign.

## 2022-06-15 ENCOUNTER — Other Ambulatory Visit: Payer: Self-pay | Admitting: Physician Assistant

## 2022-06-15 DIAGNOSIS — N6310 Unspecified lump in the right breast, unspecified quadrant: Secondary | ICD-10-CM

## 2023-11-07 ENCOUNTER — Other Ambulatory Visit: Payer: Self-pay | Admitting: Medical Genetics

## 2024-01-13 ENCOUNTER — Other Ambulatory Visit: Payer: Self-pay | Admitting: Medical Genetics

## 2024-01-13 DIAGNOSIS — Z006 Encounter for examination for normal comparison and control in clinical research program: Secondary | ICD-10-CM
